# Patient Record
Sex: Male | Born: 1990 | Hispanic: Yes | Marital: Married
Health system: Southern US, Community
[De-identification: ages and names within clinical notes are randomized; demographics above are authoritative.]

---

## 2017-04-23 ENCOUNTER — Encounter (HOSPITAL_COMMUNITY): Payer: Self-pay | Admitting: Emergency Medicine

## 2017-04-23 ENCOUNTER — Emergency Department (HOSPITAL_COMMUNITY): Payer: Self-pay

## 2017-04-23 ENCOUNTER — Emergency Department (HOSPITAL_COMMUNITY)
Admission: EM | Admit: 2017-04-23 | Discharge: 2017-04-23 | Discharge status: Against Medical Advice | Payer: Self-pay | Attending: Emergency Medicine | Admitting: Emergency Medicine

## 2017-04-23 DIAGNOSIS — M25521 Pain in right elbow: Secondary | ICD-10-CM | POA: Insufficient documentation

## 2017-04-23 DIAGNOSIS — R4182 Altered mental status, unspecified: Secondary | ICD-10-CM | POA: Insufficient documentation

## 2017-04-23 DIAGNOSIS — F10920 Alcohol use, unspecified with intoxication, uncomplicated: Secondary | ICD-10-CM | POA: Insufficient documentation

## 2017-04-23 LAB — ETHANOL: Alcohol, Ethyl (B): 180 mg/dL — ABNORMAL HIGH (ref <5)

## 2017-04-23 NOTE — ED Notes (Signed)
ED Provider at bedside. 

## 2017-04-23 NOTE — ED Notes (Signed)
Pt is awake, alert and oriented. Wants to leave. Pt got up off the stretcher and walked out.

## 2017-04-23 NOTE — ED Provider Notes (Signed)
Pt left without obtaining his discharge paperwork   Azalia Bilisampos, Cecila Satcher, MD 04/23/17 1018

## 2017-04-23 NOTE — ED Notes (Signed)
Patient transported to X-ray 

## 2017-04-23 NOTE — ED Notes (Signed)
Bed: [facility] Expected date:  Expected time:  Means of arrival:  Comments: 
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
## 2018-09-16 IMAGING — CR DG ELBOW COMPLETE 3+V*R*
4 series · 4 of 4 positions shown · non-contrast
Comparison: None.

CLINICAL DATA: Lacerations stone although

EXAM:
RIGHT ELBOW - COMPLETE 3+ VIEW

[x elbow ap right]
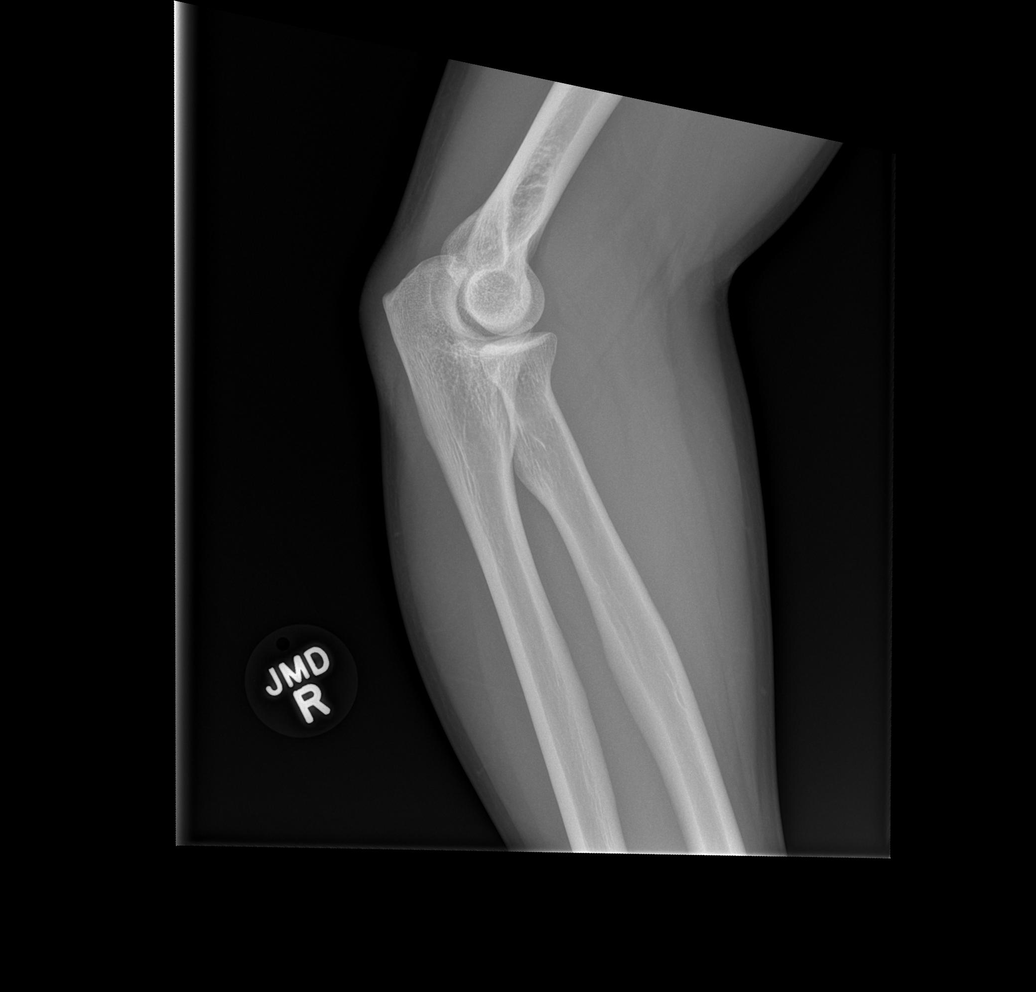

[x elbow obl right (1 of 2)]
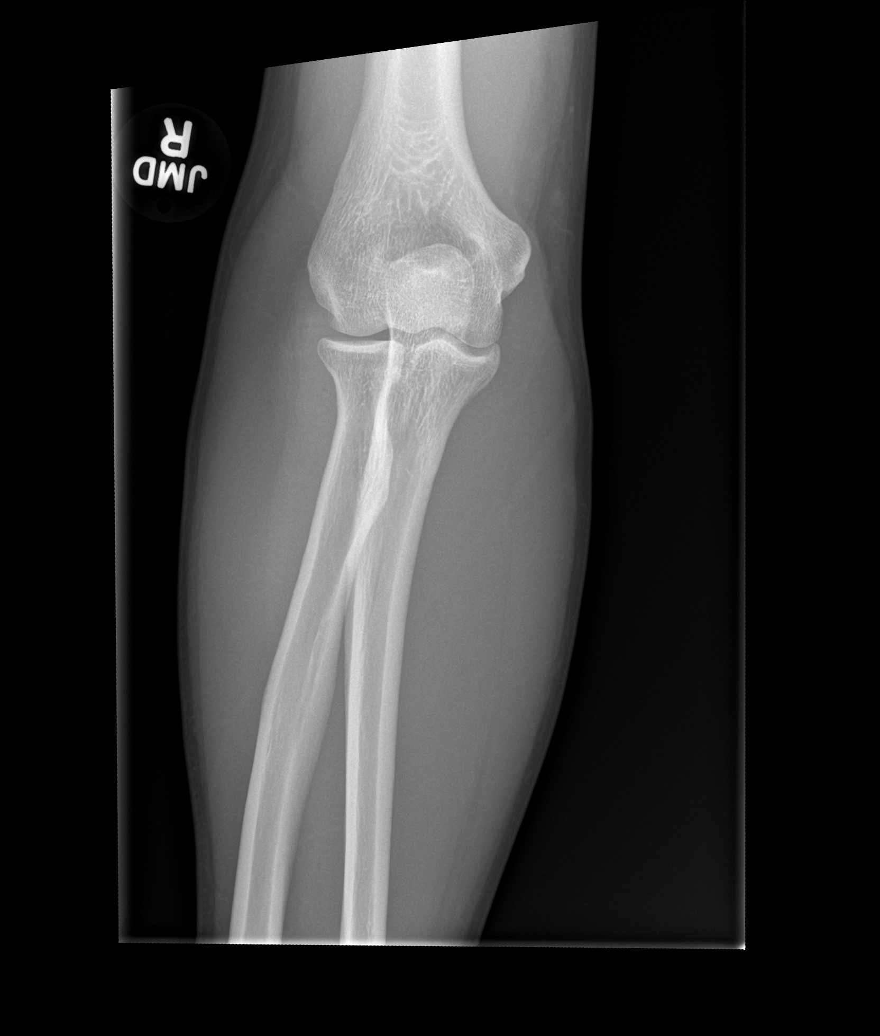

[x elbow obl right (2 of 2)]
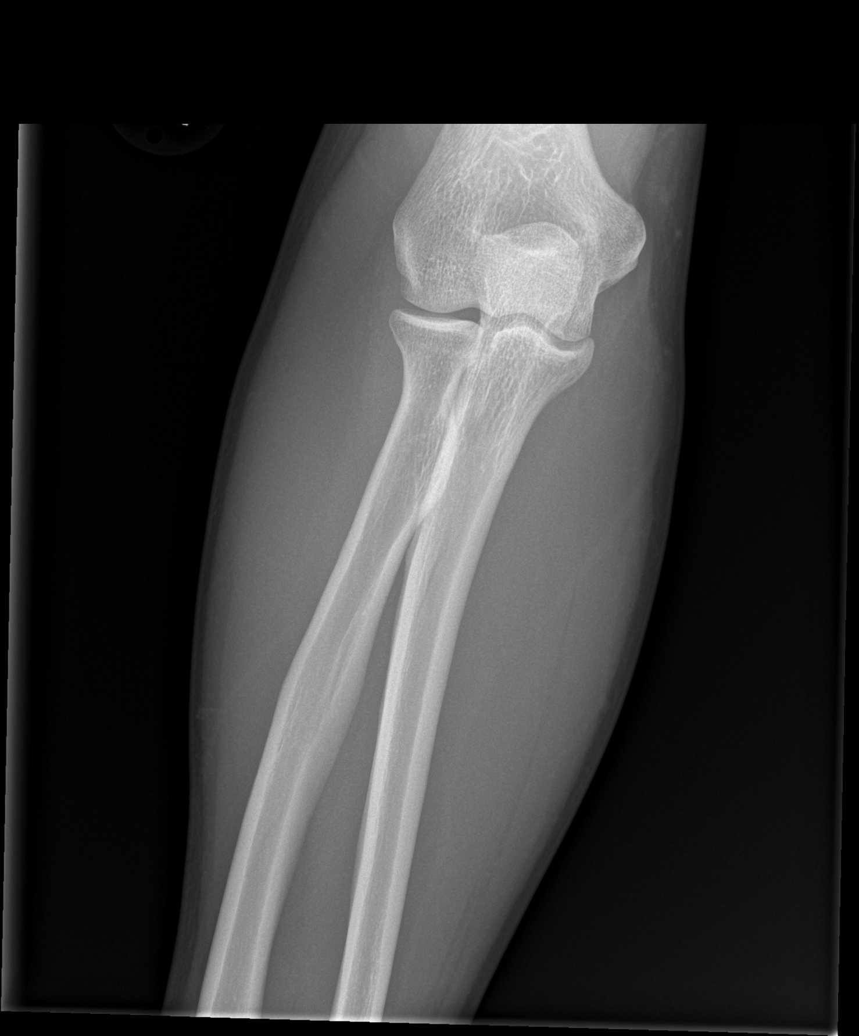

[x elbow lat right]
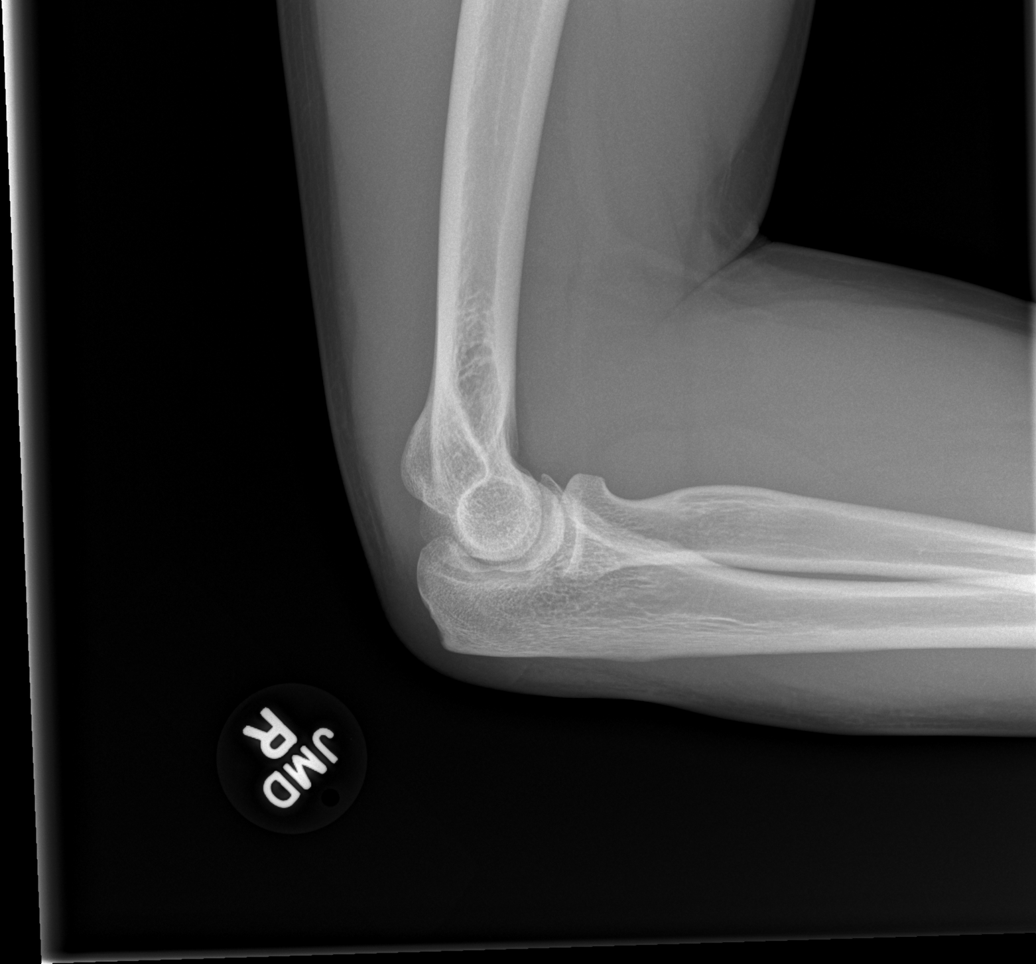

[4 of 4 positions shown; findings below may reference images not displayed]

FINDINGS: No evidence of fracture of the ulna or humerus. The radial head is
normal. No joint effusion.
IMPRESSION: No fracture or dislocation.
